# Patient Record
Sex: Male | Born: 2000 | Race: White | Hispanic: No | Marital: Single | Smoking: Former smoker
Health system: Southern US, Community
[De-identification: ages and names within clinical notes are randomized; demographics above are authoritative.]

## PROBLEM LIST (undated history)

## (undated) DIAGNOSIS — R011 Cardiac murmur, unspecified: Secondary | ICD-10-CM

## (undated) DIAGNOSIS — J45909 Unspecified asthma, uncomplicated: Secondary | ICD-10-CM

## (undated) HISTORY — PX: NO PAST SURGERIES: SHX2092

## (undated) HISTORY — DX: Cardiac murmur, unspecified: R01.1

## (undated) HISTORY — DX: Unspecified asthma, uncomplicated: J45.909

---
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
## 2018-04-28 ENCOUNTER — Other Ambulatory Visit: Payer: Self-pay | Admitting: Family Medicine

## 2018-04-28 ENCOUNTER — Ambulatory Visit: Payer: Self-pay

## 2018-04-28 DIAGNOSIS — M79641 Pain in right hand: Secondary | ICD-10-CM

## 2018-04-28 DIAGNOSIS — M25531 Pain in right wrist: Secondary | ICD-10-CM

## 2019-08-16 DIAGNOSIS — S61210S Laceration without foreign body of right index finger without damage to nail, sequela: Secondary | ICD-10-CM | POA: Diagnosis not present

## 2019-11-01 IMAGING — DX DG WRIST COMPLETE 3+V*R*
4 series · 4 of 4 positions shown · non-contrast
Comparison: None.

CLINICAL DATA: Pain following blunt trauma

EXAM:
RIGHT WRIST - COMPLETE 3+ VIEW

[wrist pa]
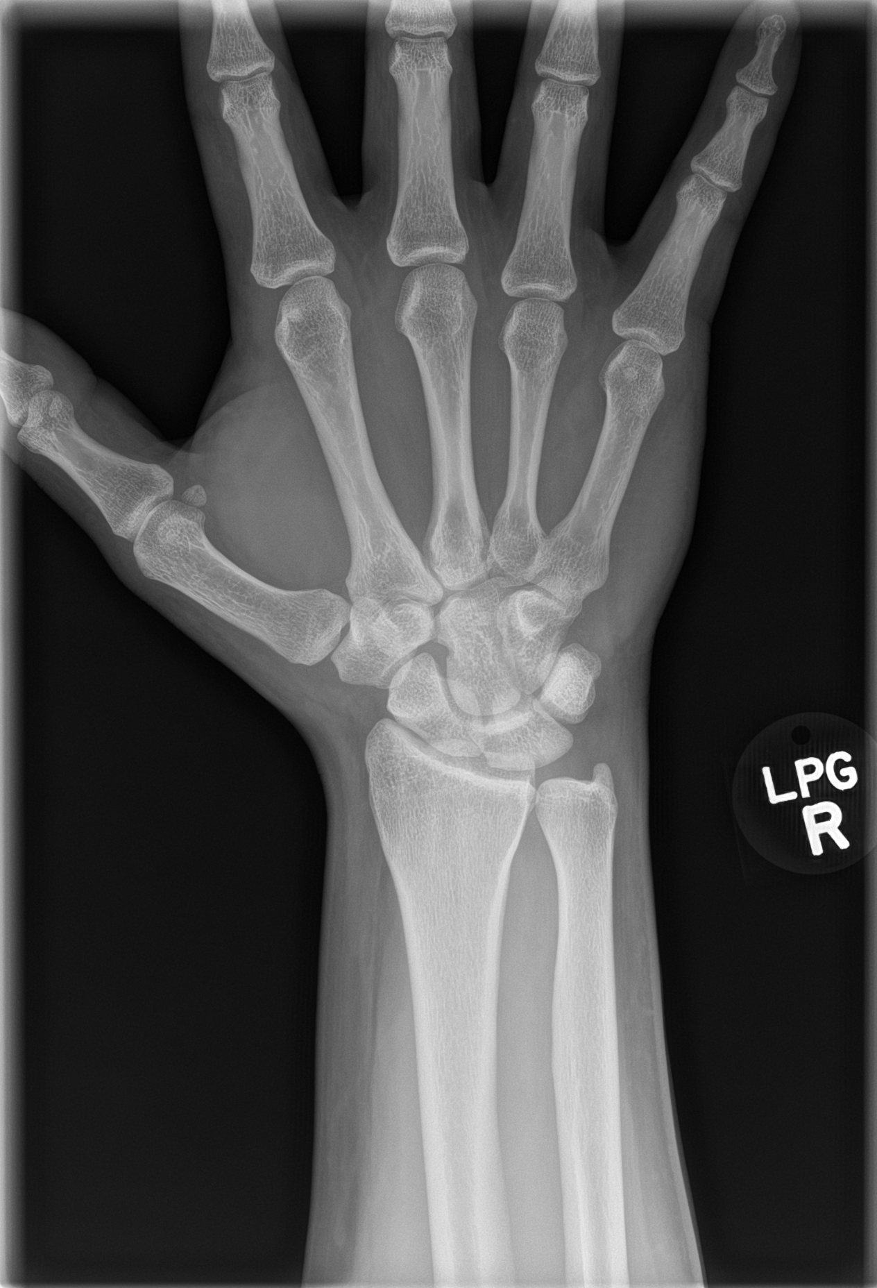

[wrist obl]
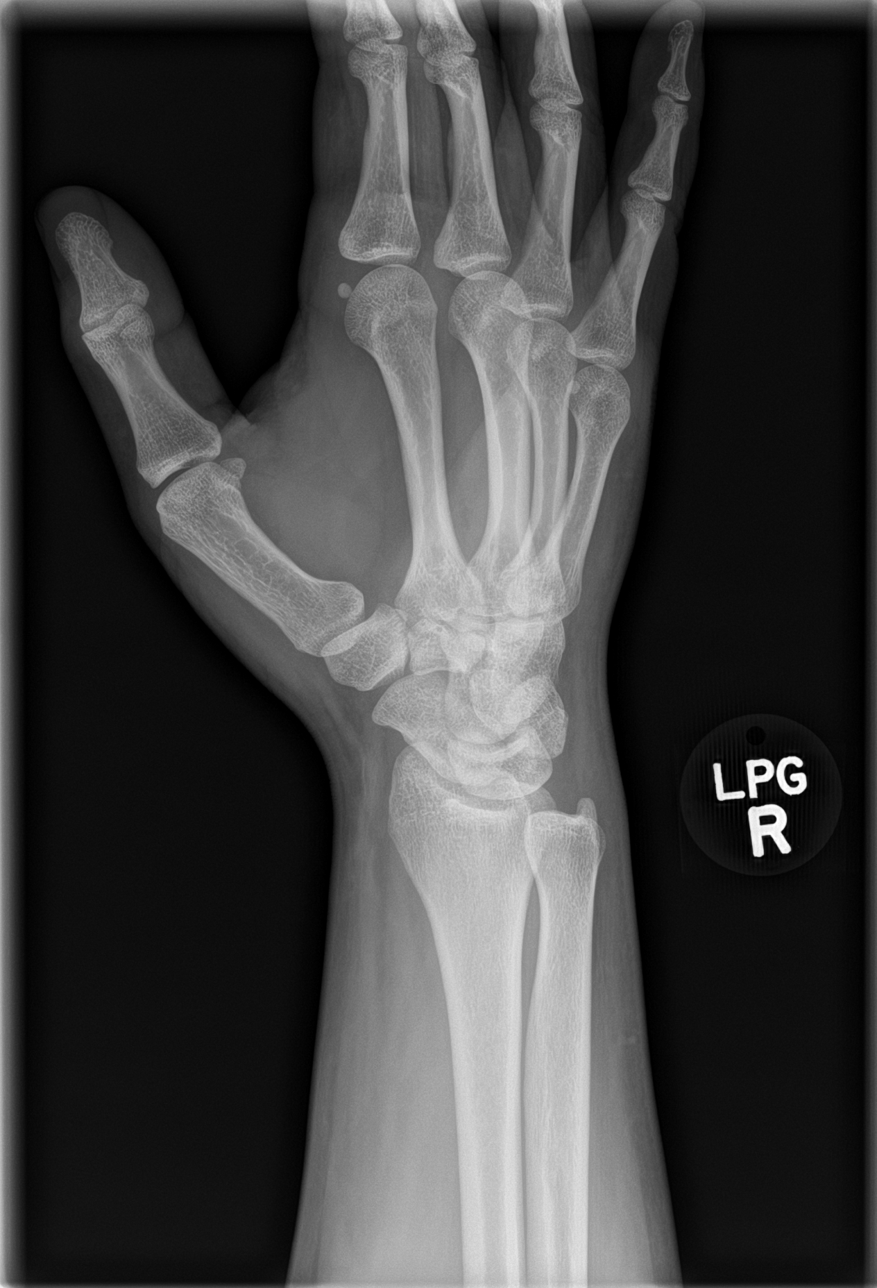

[wrist lat]
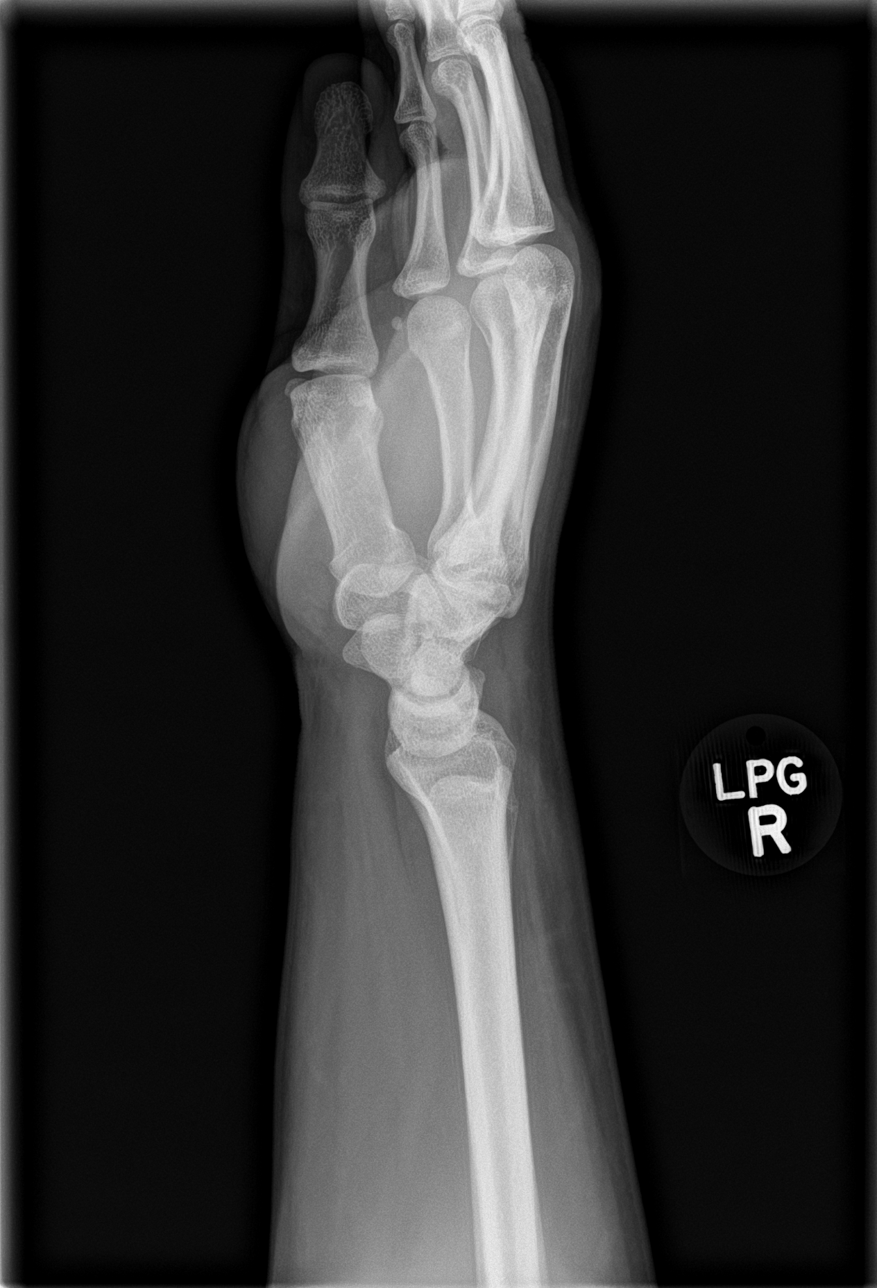

[wrist navicular]
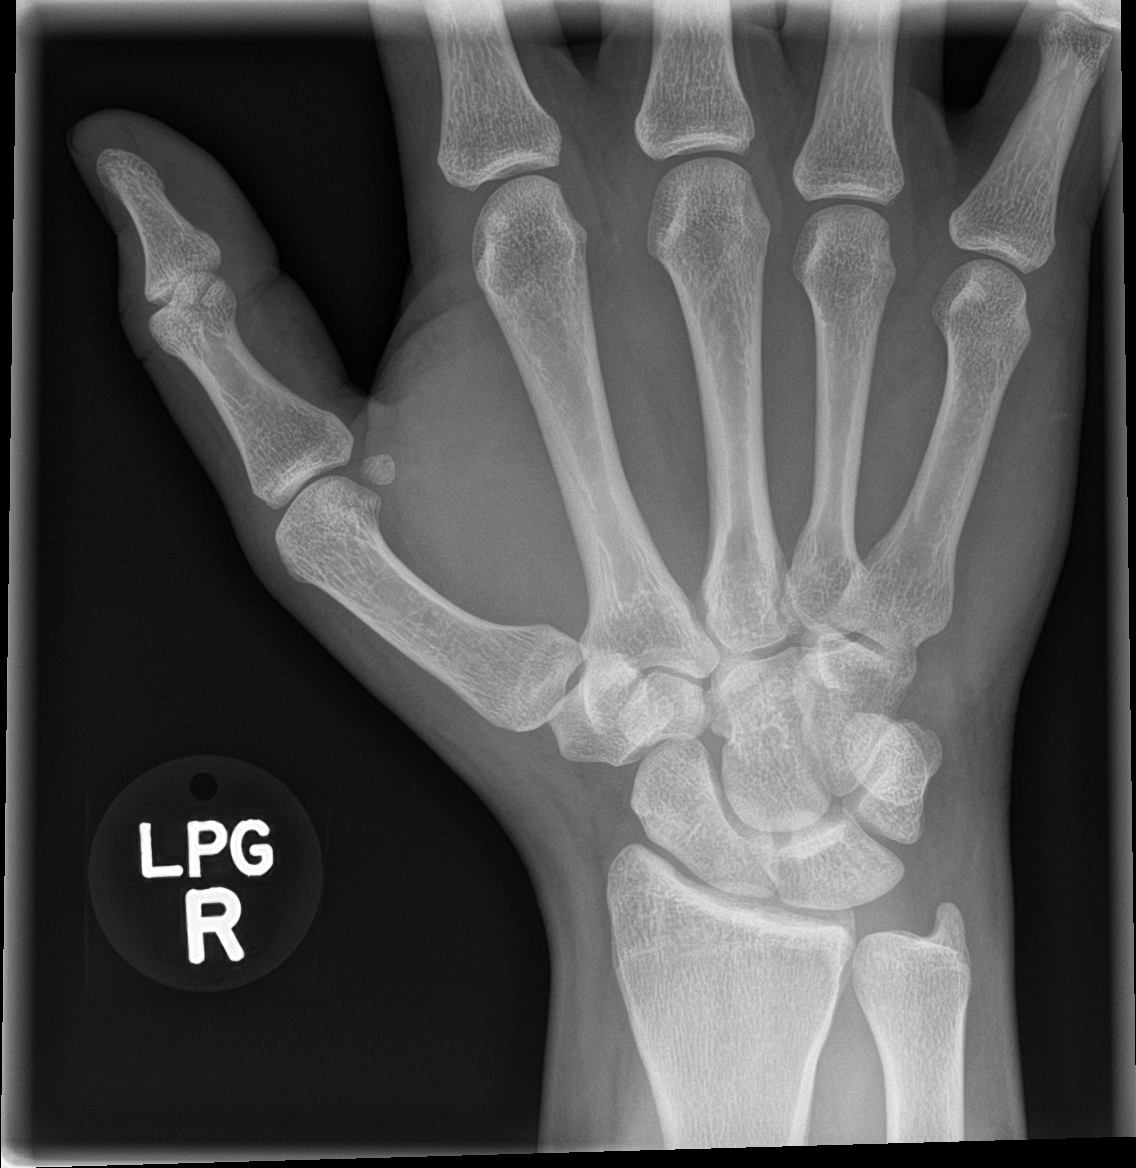

[4 of 4 positions shown; findings below may reference images not displayed]

FINDINGS: Frontal, oblique, lateral, and ulnar deviation scaphoid images were
obtained. No evident fracture or dislocation. Joint spaces appear
normal. No erosive change.
IMPRESSION: No fracture or dislocation.  No evident arthropathy.

## 2021-05-21 DIAGNOSIS — Z0000 Encounter for general adult medical examination without abnormal findings: Secondary | ICD-10-CM | POA: Diagnosis not present

## 2021-05-21 DIAGNOSIS — Z131 Encounter for screening for diabetes mellitus: Secondary | ICD-10-CM | POA: Diagnosis not present

## 2021-05-21 DIAGNOSIS — Z13220 Encounter for screening for lipoid disorders: Secondary | ICD-10-CM | POA: Diagnosis not present

## 2021-05-21 DIAGNOSIS — Z130 Encounter for screening for diseases of the blood and blood-forming organs and certain disorders involving the immune mechanism: Secondary | ICD-10-CM | POA: Diagnosis not present

## 2021-05-21 DIAGNOSIS — Z6837 Body mass index (BMI) 37.0-37.9, adult: Secondary | ICD-10-CM | POA: Diagnosis not present

## 2021-05-21 DIAGNOSIS — Z1329 Encounter for screening for other suspected endocrine disorder: Secondary | ICD-10-CM | POA: Diagnosis not present

## 2021-06-10 DIAGNOSIS — S299XXA Unspecified injury of thorax, initial encounter: Secondary | ICD-10-CM | POA: Diagnosis not present

## 2021-06-10 DIAGNOSIS — Y990 Civilian activity done for income or pay: Secondary | ICD-10-CM | POA: Diagnosis not present

## 2021-06-10 DIAGNOSIS — S63502A Unspecified sprain of left wrist, initial encounter: Secondary | ICD-10-CM | POA: Diagnosis not present

## 2021-06-10 DIAGNOSIS — S6992XA Unspecified injury of left wrist, hand and finger(s), initial encounter: Secondary | ICD-10-CM | POA: Diagnosis not present

## 2021-06-10 DIAGNOSIS — M25532 Pain in left wrist: Secondary | ICD-10-CM | POA: Diagnosis not present

## 2021-06-10 DIAGNOSIS — S3992XA Unspecified injury of lower back, initial encounter: Secondary | ICD-10-CM | POA: Diagnosis not present

## 2021-06-10 DIAGNOSIS — R0781 Pleurodynia: Secondary | ICD-10-CM | POA: Diagnosis not present

## 2021-06-10 DIAGNOSIS — S29012A Strain of muscle and tendon of back wall of thorax, initial encounter: Secondary | ICD-10-CM | POA: Diagnosis not present

## 2021-06-10 DIAGNOSIS — M546 Pain in thoracic spine: Secondary | ICD-10-CM | POA: Diagnosis not present

## 2021-06-10 DIAGNOSIS — W01198A Fall on same level from slipping, tripping and stumbling with subsequent striking against other object, initial encounter: Secondary | ICD-10-CM | POA: Diagnosis not present

## 2021-06-10 DIAGNOSIS — M5134 Other intervertebral disc degeneration, thoracic region: Secondary | ICD-10-CM | POA: Diagnosis not present

## 2021-09-26 DIAGNOSIS — M25532 Pain in left wrist: Secondary | ICD-10-CM | POA: Diagnosis not present

## 2021-09-26 DIAGNOSIS — Z6841 Body Mass Index (BMI) 40.0 and over, adult: Secondary | ICD-10-CM | POA: Diagnosis not present

## 2021-11-04 DIAGNOSIS — J020 Streptococcal pharyngitis: Secondary | ICD-10-CM | POA: Diagnosis not present

## 2021-11-04 DIAGNOSIS — Z6841 Body Mass Index (BMI) 40.0 and over, adult: Secondary | ICD-10-CM | POA: Diagnosis not present

## 2022-04-09 ENCOUNTER — Emergency Department (HOSPITAL_BASED_OUTPATIENT_CLINIC_OR_DEPARTMENT_OTHER)
Admission: EM | Admit: 2022-04-09 | Discharge: 2022-04-09 | Disposition: A | Discharge status: Home / Self Care | Payer: 59 | Attending: Emergency Medicine | Admitting: Emergency Medicine

## 2022-04-09 ENCOUNTER — Emergency Department (HOSPITAL_BASED_OUTPATIENT_CLINIC_OR_DEPARTMENT_OTHER): Payer: 59

## 2022-04-09 ENCOUNTER — Other Ambulatory Visit: Payer: Self-pay

## 2022-04-09 DIAGNOSIS — R42 Dizziness and giddiness: Secondary | ICD-10-CM | POA: Diagnosis not present

## 2022-04-09 DIAGNOSIS — R079 Chest pain, unspecified: Secondary | ICD-10-CM | POA: Insufficient documentation

## 2022-04-09 DIAGNOSIS — J45909 Unspecified asthma, uncomplicated: Secondary | ICD-10-CM | POA: Insufficient documentation

## 2022-04-09 DIAGNOSIS — E1165 Type 2 diabetes mellitus with hyperglycemia: Secondary | ICD-10-CM | POA: Insufficient documentation

## 2022-04-09 DIAGNOSIS — I10 Essential (primary) hypertension: Secondary | ICD-10-CM | POA: Insufficient documentation

## 2022-04-09 DIAGNOSIS — Z8673 Personal history of transient ischemic attack (TIA), and cerebral infarction without residual deficits: Secondary | ICD-10-CM | POA: Diagnosis not present

## 2022-04-09 LAB — BASIC METABOLIC PANEL
Anion gap: 8 (ref 5–15)
BUN: 17 mg/dL (ref 6–20)
CO2: 25 mmol/L (ref 22–32)
Calcium: 9.5 mg/dL (ref 8.9–10.3)
Chloride: 104 mmol/L (ref 98–111)
Creatinine, Ser: 0.85 mg/dL (ref 0.61–1.24)
GFR, Estimated: >60 mL/min (ref >60)
Glucose, Bld: 103 mg/dL — ABNORMAL HIGH (ref 70–99)
Potassium: 4.4 mmol/L (ref 3.5–5.1)
Sodium: 137 mmol/L (ref 135–145)

## 2022-04-09 LAB — CBC
HCT: 46.2 % (ref 39.0–52.0)
Hemoglobin: 15.2 g/dL (ref 13.0–17.0)
MCH: 28.2 pg (ref 26.0–34.0)
MCHC: 32.9 g/dL (ref 30.0–36.0)
MCV: 85.7 fL (ref 80.0–100.0)
Platelets: 315 10*3/uL (ref 150–400)
RBC: 5.39 MIL/uL (ref 4.22–5.81)
RDW: 13.9 % (ref 11.5–15.5)
WBC: 9.8 10*3/uL (ref 4.0–10.5)
nRBC: 0 % (ref 0.0–0.2)

## 2022-04-09 LAB — TROPONIN I (HIGH SENSITIVITY)
Troponin I (High Sensitivity): 2 ng/L (ref <18)
Troponin I (High Sensitivity): 2 ng/L (ref <18)

## 2022-04-09 MED ORDER — NITROGLYCERIN 0.4 MG SL SUBL
0.4000 mg | SUBLINGUAL_TABLET | SUBLINGUAL | Status: DC | PRN
  Administered 2022-04-09 (×2): 0.4 mg via SUBLINGUAL
  Filled 2022-04-09: qty 1

## 2022-04-09 MED ORDER — ASPIRIN 81 MG PO CHEW
324.0000 mg | CHEWABLE_TABLET | Freq: Once | ORAL | Status: AC
  Administered 2022-04-09: 324 mg via ORAL
  Filled 2022-04-09: qty 4

## 2022-04-09 NOTE — ED Triage Notes (Signed)
Patient presents to ED via POV from urgent care. Here with chest pain that began this morning while driving. Reports pain radiates down left arm. Endorses dizziness as well.

## 2022-04-09 NOTE — ED Notes (Signed)
Chest pain across the chest pain states , his grandad  had  hx of heart issues mom side and mom has hx of a lot of things stroke wise,

## 2022-04-09 NOTE — ED Notes (Signed)
Pt states feels a little better has h/a

## 2022-04-09 NOTE — Discharge Instructions (Signed)
Please use Tylenol or ibuprofen for pain.  You may use 600 mg ibuprofen every 6 hours or 1000 mg of Tylenol every 6 hours.  You may choose to alternate between the 2.  This would be most effective.  Not to exceed 4 g of Tylenol within 24 hours.  Not to exceed 3200 mg ibuprofen 24 hours.  

## 2022-04-09 NOTE — ED Provider Notes (Signed)
[facility] EMERGENCY DEPARTMENT Provider Note   CSN: [id number] Arrival date & time: 04/09/22  1100     History  Chief Complaint  Patient presents with   Chest Pain    Nathan Wilson is a 21 y.o. male with past medical history significant for obesity, asthma, without previous history of hypertension, high cholesterol, ACS, stroke, diabetes who endorses vape use but no cigarette smoking, denies any drug use who presents with concern for chest pain across the chest radiating to shoulders started suddenly, lasting for 10 to 15 minutes at a time.  Patient reports that it was initially around a 10 out of 10, brought him to tears, he reports he was driving at the time, denies any previous history of heavy lifting, acid reflux, anxiety, panic attacks.  He denies strong family history of early cardiac disease, reporting grandfather may have died from a heart attack in his 76s.  He reports some intermittent dizziness denies any nausea, vomiting, diaphoresis.   Chest Pain      Home Medications Prior to Admission medications   Not on File      Allergies    Patient has no known allergies.    Review of Systems   Review of Systems  Cardiovascular:  Positive for chest pain.  All other systems reviewed and are negative.   Physical Exam Updated Vital Signs BP (!) 149/90   Pulse 77   Temp 98 F (36.7 C) (Oral)   Resp 20   SpO2 99%  Physical Exam Vitals and nursing note reviewed.  Constitutional:      General: He is not in acute distress.    Appearance: Normal appearance.  HENT:     Head: Normocephalic and atraumatic.  Eyes:     General:        Right eye: No discharge.        Left eye: No discharge.  Cardiovascular:     Rate and Rhythm: Normal rate and regular rhythm.     Heart sounds: No murmur heard.    No friction rub. No gallop.  Pulmonary:     Effort: Pulmonary effort is normal.     Breath sounds: Normal breath sounds.  Chest:     Comments: No  tenderness to palpation of the chest wall Abdominal:     General: Bowel sounds are normal.     Palpations: Abdomen is soft.  Skin:    General: Skin is warm and dry.     Capillary Refill: Capillary refill takes less than 2 seconds.  Neurological:     Mental Status: He is alert and oriented to person, place, and time.  Psychiatric:        Mood and Affect: Mood normal.        Behavior: Behavior normal.     ED Results / Procedures / Treatments   Labs (all labs ordered are listed, but only abnormal results are displayed) Labs Reviewed  BASIC METABOLIC PANEL - Abnormal; Notable for the following components:      Result Value   Glucose, Bld 103 (*)    All other components within normal limits  CBC  TROPONIN I (HIGH SENSITIVITY)  TROPONIN I (HIGH SENSITIVITY)    EKG EKG Interpretation  Date/Time:  Wednesday April 09 2022 11:10:22 EST Ventricular Rate:  79 PR Interval:  132 QRS Duration: 114 QT Interval:  361 QTC Calculation: 414 R Axis:   46 Text Interpretation: Sinus rhythm Borderline intraventricular conduction delay RSR' in V1 or V2, right  VCD or RVH ST elev, probable normal early repol pattern Confirmed by Benjiman Core [phone]) on 04/09/2022 1:14:00 PM  Radiology DG Chest 2 View  Result Date: 04/09/2022 CLINICAL DATA:  Chest pain since this morning, history of heart murmur EXAM: CHEST - 2 VIEW COMPARISON:  None Available. FINDINGS: Normal heart size, mediastinal contours, and pulmonary vascularity. Lungs clear. No pleural effusion or pneumothorax. Bones unremarkable. IMPRESSION: Normal exam. Electronically Signed   By: Ulyses Southward M.D.   On: 04/09/2022 11:37    Procedures Procedures    Medications Ordered in ED Medications  nitroGLYCERIN (NITROSTAT) SL tablet 0.4 mg (0.4 mg Sublingual Given 04/09/22 1308)  aspirin chewable tablet 324 mg (324 mg Oral Given 04/09/22 1241)    ED Course/ Medical Decision Making/ A&P                           Medical Decision  Making Amount and/or Complexity of Data Reviewed Labs: ordered. Radiology: ordered.  Risk OTC drugs. Prescription drug management.   This patient is a 21 y.o. male  who presents to the ED for concern of chest pain.   Differential diagnoses prior to evaluation: The emergent differential diagnosis includes, but is not limited to,  ACS, AAS, PE, Mallory-Weiss, Boerhaave's, Pneumonia, acute bronchitis, asthma or COPD exacerbation, anxiety, MSK pain or traumatic injury to the chest, acid reflux versus other .   This is not an exhaustive differential.   Past Medical History / Co-morbidities: Obesity, vape use without other tobacco use. Patient hypertensive in ED, with no previously documented history of hypertension.   Physical Exam: Physical exam performed. The pertinent findings include: Patient with some tenderness palpation of the chest wall, increased pain with arm movement consistent with possible more musculoskeletal cause of chest pain.  He is moderate hypertension during emergency department visit, blood pressure max 161/89.  He is without tachycardia, normal oxygen saturation on room air.  Lab Tests/Imaging studies: I personally interpreted labs/imaging and the pertinent results include: Troponin 2, with no delta on second reading, unremarkable CBC, BMP with very mild hyperglycemia, glucose 103 on nonfasting lab values..  Independently interpreted plain film chest x-ray which shows no intrathoracic abnormality.  I agree with the radiologist interpretation.  Cardiac monitoring: EKG obtained and interpreted by my attending physician which shows: borderline interventricular conduction delay, mild diffuse st-elevation likely early repol pattern, no evidence of acute ischemia.   Medications: I ordered medication including asa 324, nitroglycerin SL, which improved patient's chest pain. Patient had brief headache likely 2/2 nitro which resolved after a few minutes.  I have reviewed the  patients home medicines and have made adjustments as needed.   Disposition: After consideration of the diagnostic results and the patients response to treatment, I feel that patient with nonspecific chest pain that I suspect is likely musculoskeletal in nature although no clear etiology revealed on workup today.  No evidence of acute ACS, patient with heart score of 2, despite some recent descriptors of severe pressure, patient's chest pain was not exertional in nature, he had no shortness of breath, nausea, chest pain resolved at this time, I think that he is stable for discharge, considered admission for cardiac monitoring, but I think the patient would benefit more from outpatient cardiology eval on routine basis at this time.  Extensive return precautions given.   emergency department workup does not suggest an emergent condition requiring admission or immediate intervention beyond what has been performed at this time. The  plan is: as above. The patient is safe for discharge and has been instructed to return immediately for worsening symptoms, change in symptoms or any other concerns.  Final Clinical Impression(s) / ED Diagnoses Final diagnoses:  Chest pain, unspecified type    Rx / DC Orders ED Discharge Orders          Ordered    Ambulatory referral to Cardiology       Comments: If you have not heard from the Cardiology office within the next 72 hours please call [phone].   04/09/22 1452              [place] 04/09/22 1554    Benjiman Core, MD 04/10/22 [phone]

## 2022-04-15 ENCOUNTER — Ambulatory Visit: Payer: 59 | Attending: Cardiology | Admitting: Cardiology

## 2022-04-15 ENCOUNTER — Ambulatory Visit: Payer: 59 | Admitting: Cardiology

## 2022-04-15 ENCOUNTER — Encounter: Payer: Self-pay | Admitting: Cardiology

## 2022-04-15 VITALS — BP 140/72 | HR 100 | Ht 70.0 in | Wt 304.0 lb

## 2022-04-15 DIAGNOSIS — R011 Cardiac murmur, unspecified: Secondary | ICD-10-CM

## 2022-04-15 DIAGNOSIS — R079 Chest pain, unspecified: Secondary | ICD-10-CM | POA: Insufficient documentation

## 2022-04-15 NOTE — Patient Instructions (Addendum)
Medication Instructions:  Your physician recommends that you continue on your current medications as directed. Please refer to the Current Medication list given to you today.  *If you need a refill on your cardiac medications before your next appointment, please call your pharmacy*   Lab Work: None ordered If you have labs (blood work) drawn today and your tests are completely normal, you will receive your results only by: [facility] (if you have MyChart) OR A paper copy in the mail If you have any lab test that is abnormal or we need to change your treatment, we will call you to review the results.   Testing/Procedures: Your physician has requested that you have an echocardiogram. Echocardiography is a painless test that uses sound waves to create images of your heart. It provides your doctor with information about the size and shape of your heart and how well your heart's chambers and valves are working. This procedure takes approximately one hour. There are no restrictions for this procedure. Please do NOT wear cologne, perfume, aftershave, or lotions (deodorant is allowed). Please arrive 15 minutes prior to your appointment time.    April 15, 2022      Nathan Wilson DOB: 28-Dec-2000 MRN: [id number] [place] [id number]    Please arrive 15 minutes prior to your appointment time for registration and insurance purposes.  The test will take approximately 45 minutes to complete.  How to prepare for your Exercise Stress Test: Do bring a list of your current medications with you.  If not listed below, you may take your medications as normal. Do wear comfortable clothes (no dresses or overalls) and walking shoes, tennis shoes preferred (no heels or open toed shoes are allowed) Do Not wear cologne, perfume, aftershave or lotions (deodorant is allowed).   If these instructions are not followed, your test will have to be rescheduled.  If you have  questions or concerns about your appointment, you can call the Stress Lab at [phone].  If you cannot keep your appointment, please provide 24 hours notification to the Stress Lab, to avoid a possible $50 charge to your account.    Follow-Up: At [facility], you and your health needs are our priority.  As part of our continuing mission to provide you with exceptional heart care, we have created designated Provider Care Teams.  These Care Teams include your primary Cardiologist (physician) and Advanced Practice Providers (APPs -  Physician Assistants and Nurse Practitioners) who all work together to provide you with the care you need, when you need it.  We recommend signing up for the patient portal called "MyChart".  Sign up information is provided on this After Visit Summary.  MyChart is used to connect with patients for Virtual Visits (Telemedicine).  Patients are able to view lab/test results, encounter notes, upcoming appointments, etc.  Non-urgent messages can be sent to your provider as well.   To learn more about what you can do with MyChart, go to [email or web address].    Your next appointment:   9 month(s)  The format for your next appointment:   In Person  Provider:   Jyl Heinz, MD    Other Instructions none  Important Information About Sugar

## 2022-04-15 NOTE — Progress Notes (Unsigned)
Cardiology Office Note:    Date:  04/15/2022   ID:  Nathan Wilson, DOB 2000-07-03, MRN [id number]  PCP:  Silvestre Gunner, MD  Cardiologist:  Jenean Lindau, MD   Referring MD: Anselmo Pickler, *    ASSESSMENT:    1. Chest pain of uncertain etiology   2. Cardiac murmur    PLAN:    In order of problems listed above:  Primary prevention stressed with the patient.  Importance of compliance with diet medication stressed any vocalized understanding. Chest pain: Atypical in nature.  To reassure him I will do a treadmill stress test.  He is agreeable. Cardiac murmur: Echocardiogram will be done to assess murmur heard on auscultation. Obesity and elevated lipids: Lifestyle modification urged weight reduction stressed risks of obesity explained and he promises to do better. Patient will be seen in follow-up appointment in 6 months or earlier if the patient has any concerns    Medication Adjustments/Labs and Tests Ordered: Current medicines are reviewed at length with the patient today.  Concerns regarding medicines are outlined above.  No orders of the defined types were placed in this encounter.  No orders of the defined types were placed in this encounter.    History of Present Illness:    Nathan Wilson is a 22 y.o. male who is being seen today for the evaluation of chest pain at the request of Carlus Pavlov, Christian H, *.  Patient is a pleasant 22 year old male.  He has no significant past medical history.  His lipids are mildly elevated.  He denies any history of hypertension dyslipidemia or diabetes mellitus.  He tells me that he is active gentleman and walks on a regular basis.  He tells me that he went to the emergency room with chest pain.  No radiation to the pain to the neck or to the arms.  It was constant and it has gotten better.  At the time of my evaluation, the patient is alert awake oriented and in no distress.  Does not increase with exertion.  Past  Medical History:  Diagnosis Date   Asthma    Heart murmur      Current Medications: Current Meds  Medication Sig   albuterol (VENTOLIN HFA) 108 (90 Base) MCG/ACT inhaler Inhale 1 puff into the lungs every 6 (six) hours as needed for wheezing or shortness of breath.     Allergies:   Patient has no known allergies.   Social History   Socioeconomic History   Marital status: Single    Spouse name: Not on file   Number of children: Not on file   Years of education: Not on file   Highest education level: Not on file  Occupational History   Not on file  Tobacco Use   Smoking status: Former    Types: Cigarettes   Smokeless tobacco: Current    Types: Chew, Snuff  Vaping Use   Vaping Use: Every day  Substance and Sexual Activity   Alcohol use: Never   Drug use: Never   Sexual activity: Not on file  Other Topics Concern   Not on file  Social History Narrative   Not on file   Social Determinants of Health   Financial Resource Strain: Not on file  Food Insecurity: Not on file  Transportation Needs: Not on file  Physical Activity: Not on file  Stress: Not on file  Social Connections: Not on file     Family History: The patient's family history  includes Heart Problems in his mother; Heart attack in his maternal grandfather; Heart disease in his maternal grandmother.  ROS:   Please see the history of present illness.    All other systems reviewed and are negative.  EKGs/Labs/Other Studies Reviewed:    The following studies were reviewed today: I reviewed EKG done and was largely unremarkable.   Recent Labs: 04/09/2022: BUN 17; Creatinine, Ser 0.85; Hemoglobin 15.2; Platelets 315; Potassium 4.4; Sodium 137  Recent Lipid Panel No results found for: "CHOL", "TRIG", "HDL", "CHOLHDL", "VLDL", "LDLCALC", "LDLDIRECT"  Physical Exam:    VS:  BP (!) 140/72 (BP Location: Left Arm, Patient Position: Sitting, Cuff Size: Normal)   Pulse 100   Ht 5\' 10"  (1.778 m)   Wt (!)  304 lb (137.9 kg)   SpO2 97%   BMI 43.62 kg/m     Wt Readings from Last 3 Encounters:  04/15/22 (!) 304 lb (137.9 kg)     GEN: Patient is in no acute distress HEENT: Normal NECK: No JVD; No carotid bruits LYMPHATICS: No lymphadenopathy CARDIAC: S1 S2 regular, 2/6 systolic murmur at the apex. RESPIRATORY:  Clear to auscultation without rales, wheezing or rhonchi  ABDOMEN: Soft, non-tender, non-distended MUSCULOSKELETAL:  No edema; No deformity  SKIN: Warm and dry NEUROLOGIC:  Alert and oriented x 3 PSYCHIATRIC:  Normal affect    Signed, Jenean Lindau, MD  04/15/2022 4:11 PM    [facility]

## 2022-04-16 ENCOUNTER — Encounter: Payer: Self-pay | Admitting: Cardiology
